# Patient Record
Sex: Female | Born: 1985 | Race: White | Hispanic: No | Marital: Married | Smoking: Never smoker
Health system: Southern US, Community
[De-identification: ages and names within clinical notes are randomized; demographics above are authoritative.]

## PROBLEM LIST (undated history)

## (undated) HISTORY — PX: ARTHROSCOPIC REPAIR ACL: SUR80

---

## 2018-05-15 ENCOUNTER — Other Ambulatory Visit: Payer: Self-pay | Admitting: Otolaryngology

## 2018-05-15 ENCOUNTER — Ambulatory Visit
Admission: RE | Admit: 2018-05-15 | Discharge: 2018-05-15 | Disposition: A | Discharge status: Home / Self Care | Payer: 59 | Source: Ambulatory Visit | Attending: Otolaryngology | Admitting: Otolaryngology

## 2018-05-15 DIAGNOSIS — R059 Cough, unspecified: Secondary | ICD-10-CM

## 2018-05-15 DIAGNOSIS — J40 Bronchitis, not specified as acute or chronic: Secondary | ICD-10-CM

## 2018-05-15 DIAGNOSIS — R05 Cough: Secondary | ICD-10-CM

## 2019-08-29 ENCOUNTER — Other Ambulatory Visit: Payer: 59

## 2020-06-08 ENCOUNTER — Other Ambulatory Visit: Payer: Self-pay | Admitting: Obstetrics & Gynecology

## 2020-06-08 DIAGNOSIS — N979 Female infertility, unspecified: Secondary | ICD-10-CM

## 2020-06-28 ENCOUNTER — Ambulatory Visit
Admission: RE | Admit: 2020-06-28 | Discharge: 2020-06-28 | Disposition: A | Discharge status: Home / Self Care | Payer: 59 | Source: Ambulatory Visit | Attending: Obstetrics & Gynecology | Admitting: Obstetrics & Gynecology

## 2020-06-28 DIAGNOSIS — N979 Female infertility, unspecified: Secondary | ICD-10-CM

## 2020-07-24 NOTE — L&D Delivery Note (Signed)
Delivery Note Labor onset: 04/04/2021  Labor Onset Time: 11:59 PM Complete dilation at 3:59 PM  Onset of pushing at 3:59 PM FHR second stage Cat 2 Analgesia/Anesthesia intrapartum: epidural  Guided pushing with maternal urge. Delivery of a viable female at 4:57 PM. Fetal head delivered in OA with restitution ROA position.  Nuchal cord: N/A.  Infant placed on maternal abd, dried, and tactile stim. Spontaneous cry at delivery. Cord double clamped after 1 minute and cut by father .  2 Rns, Dr. Normand Sloop, Selena Batten Jazalyn Mondor present for birth.  Cord blood sample collected: Yes  Arterial cord blood sample collected: N/A  Placenta delivered Tomasa Blase, intact, with 3 VC via Binnie Kand Maneuver.  Placenta to pathology for possible chorio. Uterine tone firm, bleeding minimal  2nd degree laceration identified.  Anesthesia: epidural Repair: 3-0 vicryl  QBL/EBL (mL): 300 Complications: n/a APGAR: APGAR (1 MIN): 8   APGAR (5 MINS): 9   APGAR (10 MINS):   Mom to postpartum.  Baby to Couplet care / Skin to Skin.  Delivery and repair per Oley Balm, CNM Gerhard Munch Tyresha Fede MSN, CNM 04/05/2021, 5:57 PM

## 2020-09-06 LAB — OB RESULTS CONSOLE ABO/RH: RH Type: POSITIVE

## 2020-09-06 LAB — OB RESULTS CONSOLE RPR: RPR: NONREACTIVE

## 2020-09-06 LAB — OB RESULTS CONSOLE HIV ANTIBODY (ROUTINE TESTING): HIV: NONREACTIVE

## 2020-09-06 LAB — OB RESULTS CONSOLE HEPATITIS B SURFACE ANTIGEN: Hepatitis B Surface Ag: NEGATIVE

## 2020-09-06 LAB — OB RESULTS CONSOLE GC/CHLAMYDIA
Chlamydia: NEGATIVE
Gonorrhea: NEGATIVE

## 2020-09-06 LAB — OB RESULTS CONSOLE ANTIBODY SCREEN: Antibody Screen: NEGATIVE

## 2020-11-12 ENCOUNTER — Ambulatory Visit: Payer: 59

## 2021-03-16 LAB — OB RESULTS CONSOLE GBS: GBS: NEGATIVE

## 2021-03-29 ENCOUNTER — Encounter (HOSPITAL_COMMUNITY): Payer: Self-pay

## 2021-03-29 ENCOUNTER — Other Ambulatory Visit: Payer: Self-pay

## 2021-03-29 ENCOUNTER — Inpatient Hospital Stay (HOSPITAL_COMMUNITY)
Admission: AD | Admit: 2021-03-29 | Discharge: 2021-03-29 | Disposition: A | Discharge status: Home / Self Care | Payer: BC Managed Care – PPO | Attending: Obstetrics & Gynecology | Admitting: Obstetrics & Gynecology

## 2021-03-29 DIAGNOSIS — Z3493 Encounter for supervision of normal pregnancy, unspecified, third trimester: Secondary | ICD-10-CM | POA: Diagnosis not present

## 2021-03-29 DIAGNOSIS — Z3689 Encounter for other specified antenatal screening: Secondary | ICD-10-CM

## 2021-03-29 DIAGNOSIS — Z3A39 39 weeks gestation of pregnancy: Secondary | ICD-10-CM

## 2021-03-29 DIAGNOSIS — Z0371 Encounter for suspected problem with amniotic cavity and membrane ruled out: Secondary | ICD-10-CM | POA: Diagnosis not present

## 2021-03-29 LAB — POCT FERN TEST: POCT Fern Test: NEGATIVE

## 2021-03-29 NOTE — MAU Provider Note (Signed)
Event Date/Time   First Provider Initiated Contact with Patient 03/29/21 1107       S: Ms. Kathy Buchanan is a 35 y.o. G1P0 at [redacted]w[redacted]d  who presents to MAU today complaining of leaking of fluid since Saturday 09/03. She denies vaginal bleeding. She endorses mild contractions. She reports normal fetal movement.  Sexual intercourse last night.  Pain score 1/10.  Patient receives care with [facility].  O: BP 127/81 (BP Location: Right Arm)   Pulse 81   Temp 98.1 F (36.7 C) (Oral)   Resp 17   Ht 5\' 6"  (1.676 m)   Wt 80.6 kg   SpO2 100%   BMI 28.68 kg/m  GENERAL: Well-developed, well-nourished female in no acute distress.  HEAD: Normocephalic, atraumatic.  CHEST: Normal effort of breathing, regular heart rate ABDOMEN: Soft, nontender, gravid PELVIC: Normal external female genitalia. Vagina is pink and rugated. Cervix with normal contour, no lesions. Normal discharge.  Negative pooling.   Cervical exam: FT/60/ballotable per RN exam   Fetal Monitoring: Baseline:135 Variability: Mod Accelerations: 15 x 15 Decelerations: None Contractions: Irregular, occasional  A: SIUP at [redacted]w[redacted]d  Cat I tracing S/p sterile speculum exam performed by CNM Negative pooling Negative gush with Valsalva Negative fern Patient declines one hour of monitoring to assess for cervical change  P: Discharge home in stable condition with labor precautions  [redacted]w[redacted]d, CNM 03/29/2021 12:11 PM

## 2021-03-29 NOTE — MAU Note (Signed)
Presents stating she's had intermittent LOF since Saturday.  States leaked fluid Saturday, Monday and today.  Denies VB.  Endorses +FM.

## 2021-04-01 ENCOUNTER — Other Ambulatory Visit: Payer: Self-pay | Admitting: Obstetrics & Gynecology

## 2021-04-01 ENCOUNTER — Telehealth (HOSPITAL_COMMUNITY): Payer: Self-pay | Admitting: *Deleted

## 2021-04-01 NOTE — Telephone Encounter (Signed)
Preadmission screen  

## 2021-04-04 ENCOUNTER — Encounter (HOSPITAL_COMMUNITY): Payer: Self-pay

## 2021-04-04 ENCOUNTER — Encounter (HOSPITAL_COMMUNITY): Payer: Self-pay | Admitting: *Deleted

## 2021-04-04 ENCOUNTER — Telehealth (HOSPITAL_COMMUNITY): Payer: Self-pay | Admitting: *Deleted

## 2021-04-04 NOTE — Telephone Encounter (Signed)
Preadmission screen  

## 2021-04-05 ENCOUNTER — Inpatient Hospital Stay (HOSPITAL_COMMUNITY): Payer: BC Managed Care – PPO | Admitting: Anesthesiology

## 2021-04-05 ENCOUNTER — Other Ambulatory Visit: Payer: Self-pay

## 2021-04-05 ENCOUNTER — Inpatient Hospital Stay (HOSPITAL_COMMUNITY)
Admission: AD | Admit: 2021-04-05 | Discharge: 2021-04-07 | DRG: 807 | Disposition: A | Discharge status: Home / Self Care | Payer: BC Managed Care – PPO | Attending: Obstetrics & Gynecology | Admitting: Obstetrics & Gynecology

## 2021-04-05 ENCOUNTER — Encounter (HOSPITAL_COMMUNITY): Payer: Self-pay | Admitting: Obstetrics & Gynecology

## 2021-04-05 DIAGNOSIS — Z3A40 40 weeks gestation of pregnancy: Secondary | ICD-10-CM | POA: Diagnosis not present

## 2021-04-05 DIAGNOSIS — O480 Post-term pregnancy: Principal | ICD-10-CM | POA: Diagnosis present

## 2021-04-05 DIAGNOSIS — O26893 Other specified pregnancy related conditions, third trimester: Secondary | ICD-10-CM | POA: Diagnosis present

## 2021-04-05 DIAGNOSIS — Z20822 Contact with and (suspected) exposure to covid-19: Secondary | ICD-10-CM | POA: Diagnosis present

## 2021-04-05 DIAGNOSIS — O321XX0 Maternal care for breech presentation, not applicable or unspecified: Secondary | ICD-10-CM | POA: Diagnosis present

## 2021-04-05 LAB — CBC
HCT: 37.6 % (ref 36.0–46.0)
Hemoglobin: 12.7 g/dL (ref 12.0–15.0)
MCH: 31 pg (ref 26.0–34.0)
MCHC: 33.8 g/dL (ref 30.0–36.0)
MCV: 91.7 fL (ref 80.0–100.0)
Platelets: 258 10*3/uL (ref 150–400)
RBC: 4.1 MIL/uL (ref 3.87–5.11)
RDW: 11.8 % (ref 11.5–15.5)
WBC: 16.8 10*3/uL — ABNORMAL HIGH (ref 4.0–10.5)
nRBC: 0 % (ref 0.0–0.2)

## 2021-04-05 LAB — TYPE AND SCREEN
ABO/RH(D): O POS
Antibody Screen: NEGATIVE

## 2021-04-05 LAB — POCT FERN TEST: POCT Fern Test: POSITIVE

## 2021-04-05 LAB — RESP PANEL BY RT-PCR (FLU A&B, COVID) ARPGX2
Influenza A by PCR: NEGATIVE
Influenza B by PCR: NEGATIVE
SARS Coronavirus 2 by RT PCR: NEGATIVE

## 2021-04-05 LAB — RPR: RPR Ser Ql: NONREACTIVE

## 2021-04-05 MED ORDER — AMPICILLIN-SULBACTAM SODIUM 3 (2-1) G IJ SOLR
3.0000 g | Freq: Four times a day (QID) | INTRAMUSCULAR | Status: AC
  Administered 2021-04-05 – 2021-04-06 (×4): 3 g via INTRAVENOUS
  Filled 2021-04-05 (×4): qty 8

## 2021-04-05 MED ORDER — OXYTOCIN-SODIUM CHLORIDE 30-0.9 UT/500ML-% IV SOLN: 1.0000 m[IU]/min | INTRAVENOUS | Status: DC

## 2021-04-05 MED ORDER — ONDANSETRON HCL 4 MG/2ML IJ SOLN: 4.0000 mg | INTRAMUSCULAR | Status: DC | PRN

## 2021-04-05 MED ORDER — OXYTOCIN-SODIUM CHLORIDE 30-0.9 UT/500ML-% IV SOLN: 2.5000 [IU]/h | INTRAVENOUS | Status: DC

## 2021-04-05 MED ORDER — SIMETHICONE 80 MG PO CHEW: 80.0000 mg | CHEWABLE_TABLET | ORAL | Status: DC | PRN

## 2021-04-05 MED ORDER — LIDOCAINE-EPINEPHRINE (PF) 2 %-1:200000 IJ SOLN
INTRAMUSCULAR | Status: DC | PRN
  Administered 2021-04-05: 5 mL via EPIDURAL

## 2021-04-05 MED ORDER — DIPHENHYDRAMINE HCL 50 MG/ML IJ SOLN
12.5000 mg | INTRAMUSCULAR | Status: DC | PRN
Start: 2021-04-05 — End: 2021-04-05

## 2021-04-05 MED ORDER — DIBUCAINE (PERIANAL) 1 % EX OINT: 1.0000 "application " | TOPICAL_OINTMENT | CUTANEOUS | Status: DC | PRN

## 2021-04-05 MED ORDER — TETANUS-DIPHTH-ACELL PERTUSSIS 5-2.5-18.5 LF-MCG/0.5 IM SUSY: 0.5000 mL | PREFILLED_SYRINGE | Freq: Once | INTRAMUSCULAR | Status: DC

## 2021-04-05 MED ORDER — IBUPROFEN 600 MG PO TABS
600.0000 mg | ORAL_TABLET | Freq: Four times a day (QID) | ORAL | Status: DC
  Administered 2021-04-05 – 2021-04-07 (×6): 600 mg via ORAL
  Filled 2021-04-05 (×6): qty 1

## 2021-04-05 MED ORDER — TERBUTALINE SULFATE 1 MG/ML IJ SOLN: 0.2500 mg | Freq: Once | INTRAMUSCULAR | Status: DC | PRN

## 2021-04-05 MED ORDER — BENZOCAINE-MENTHOL 20-0.5 % EX AERO
1.0000 "application " | INHALATION_SPRAY | CUTANEOUS | Status: DC | PRN
  Administered 2021-04-05: 1 via TOPICAL
  Filled 2021-04-05: qty 56

## 2021-04-05 MED ORDER — ZOLPIDEM TARTRATE 5 MG PO TABS: 5.0000 mg | ORAL_TABLET | Freq: Every evening | ORAL | Status: DC | PRN

## 2021-04-05 MED ORDER — LIDOCAINE HCL (PF) 1 % IJ SOLN: 30.0000 mL | INTRAMUSCULAR | Status: DC | PRN

## 2021-04-05 MED ORDER — PHENYLEPHRINE 40 MCG/ML (10ML) SYRINGE FOR IV PUSH (FOR BLOOD PRESSURE SUPPORT)
80.0000 ug | PREFILLED_SYRINGE | INTRAVENOUS | Status: DC | PRN
  Administered 2021-04-05: 80 ug via INTRAVENOUS
  Filled 2021-04-05: qty 10

## 2021-04-05 MED ORDER — PHENYLEPHRINE 40 MCG/ML (10ML) SYRINGE FOR IV PUSH (FOR BLOOD PRESSURE SUPPORT)
80.0000 ug | PREFILLED_SYRINGE | INTRAVENOUS | Status: AC | PRN
  Administered 2021-04-05 (×3): 80 ug via INTRAVENOUS

## 2021-04-05 MED ORDER — SODIUM CHLORIDE 0.9 % IV SOLN
3.0000 g | Freq: Four times a day (QID) | INTRAVENOUS | Status: DC
  Administered 2021-04-05: 3 g via INTRAVENOUS
  Filled 2021-04-05 (×3): qty 8

## 2021-04-05 MED ORDER — LACTATED RINGERS IV SOLN
500.0000 mL | Freq: Once | INTRAVENOUS | Status: AC
  Administered 2021-04-05: 500 mL via INTRAVENOUS

## 2021-04-05 MED ORDER — ONDANSETRON HCL 4 MG/2ML IJ SOLN: 4.0000 mg | Freq: Four times a day (QID) | INTRAMUSCULAR | Status: DC | PRN

## 2021-04-05 MED ORDER — ONDANSETRON HCL 4 MG PO TABS: 4.0000 mg | ORAL_TABLET | ORAL | Status: DC | PRN

## 2021-04-05 MED ORDER — SODIUM CHLORIDE 0.9 % IV SOLN: INTRAVENOUS | Status: DC

## 2021-04-05 MED ORDER — LACTATED RINGERS IV SOLN: INTRAVENOUS | Status: DC

## 2021-04-05 MED ORDER — ACETAMINOPHEN 500 MG PO TABS
1000.0000 mg | ORAL_TABLET | Freq: Four times a day (QID) | ORAL | Status: DC | PRN
  Administered 2021-04-05: 1000 mg via ORAL
  Filled 2021-04-05: qty 2

## 2021-04-05 MED ORDER — ACETAMINOPHEN 325 MG PO TABS
650.0000 mg | ORAL_TABLET | ORAL | Status: DC | PRN
  Administered 2021-04-06 – 2021-04-07 (×5): 650 mg via ORAL
  Filled 2021-04-05 (×5): qty 2

## 2021-04-05 MED ORDER — LACTATED RINGERS IV SOLN
500.0000 mL | INTRAVENOUS | Status: DC | PRN
  Administered 2021-04-05 (×2): 500 mL via INTRAVENOUS

## 2021-04-05 MED ORDER — OXYTOCIN-SODIUM CHLORIDE 30-0.9 UT/500ML-% IV SOLN
1.0000 m[IU]/min | INTRAVENOUS | Status: DC
  Administered 2021-04-05: 2 m[IU]/min via INTRAVENOUS
  Filled 2021-04-05: qty 500

## 2021-04-05 MED ORDER — OXYCODONE HCL 5 MG PO TABS: 5.0000 mg | ORAL_TABLET | ORAL | Status: DC | PRN

## 2021-04-05 MED ORDER — SENNOSIDES-DOCUSATE SODIUM 8.6-50 MG PO TABS
2.0000 | ORAL_TABLET | Freq: Every day | ORAL | Status: DC
  Administered 2021-04-06: 2 via ORAL
  Filled 2021-04-05: qty 2

## 2021-04-05 MED ORDER — OXYTOCIN BOLUS FROM INFUSION
333.0000 mL | Freq: Once | INTRAVENOUS | Status: AC
  Administered 2021-04-05: 333 mL via INTRAVENOUS

## 2021-04-05 MED ORDER — OXYCODONE HCL 5 MG PO TABS
10.0000 mg | ORAL_TABLET | ORAL | Status: DC | PRN
Start: 2021-04-05 — End: 2021-04-07

## 2021-04-05 MED ORDER — ACETAMINOPHEN 325 MG PO TABS: 650.0000 mg | ORAL_TABLET | ORAL | Status: DC | PRN

## 2021-04-05 MED ORDER — SOD CITRATE-CITRIC ACID 500-334 MG/5ML PO SOLN: 30.0000 mL | ORAL | Status: DC | PRN

## 2021-04-05 MED ORDER — EPHEDRINE 5 MG/ML INJ: 10.0000 mg | INTRAVENOUS | Status: DC | PRN

## 2021-04-05 MED ORDER — DIPHENHYDRAMINE HCL 25 MG PO CAPS: 25.0000 mg | ORAL_CAPSULE | Freq: Four times a day (QID) | ORAL | Status: DC | PRN

## 2021-04-05 MED ORDER — FENTANYL-BUPIVACAINE-NACL 0.5-0.125-0.9 MG/250ML-% EP SOLN
12.0000 mL/h | EPIDURAL | Status: DC | PRN
  Administered 2021-04-05: 12 mL/h via EPIDURAL
  Filled 2021-04-05: qty 250

## 2021-04-05 MED ORDER — WITCH HAZEL-GLYCERIN EX PADS: 1.0000 "application " | MEDICATED_PAD | CUTANEOUS | Status: DC | PRN

## 2021-04-05 MED ORDER — PRENATAL MULTIVITAMIN CH
1.0000 | ORAL_TABLET | Freq: Every day | ORAL | Status: DC
  Administered 2021-04-06: 1 via ORAL
  Filled 2021-04-05: qty 1

## 2021-04-05 MED ORDER — COCONUT OIL OIL: 1.0000 "application " | TOPICAL_OIL | Status: DC | PRN

## 2021-04-05 NOTE — Plan of Care (Signed)

## 2021-04-05 NOTE — H&P (Signed)
OB ADMISSION/ HISTORY & PHYSICAL:  Admission Date: 04/05/2021  3:55 AM  Admit Diagnosis: Normal labor  Kathy Buchanan is a 35 y.o. female G1P0 [redacted]w[redacted]d presenting for labor eval and LOF. Endorses active FM, denies vaginal bleeding. ROM @ 2359 and ctx began @ 0030  History of current pregnancy: G1P0   Patient entered care with [facility] at 11+2 wks.   EDC 9/12 by [place] @ 8+3 wks Anatomy scan:  20+2 wks, complete w/ posterior placenta Antenatal testing: N/A Last evaluation: 22+2  wks breech presentation w/ posterior placenta  Significant prenatal events:  Patient Active Problem List   Diagnosis Date Noted   Normal labor 04/05/2021    Prenatal Labs: ABO, Rh: O/Positive/-- (02/14 0000) Antibody: Negative (02/14 0000) Rubella:   Immune RPR: Nonreactive (02/14 0000)  HBsAg: Negative (02/14 0000)  HIV: Non-reactive (02/14 0000)  GTT: abnormal 1 hr, normal 3 hr GBS: Negative/-- (08/24 0000)  GC/CHL: neg/neg Genetics: low-risk Tdap/influenza vaccines: declined both   OB History  Gravida Para Term Preterm AB Living  1            SAB IAB Ectopic Multiple Live Births               # Outcome Date GA Lbr Len/2nd Weight Sex Delivery Anes PTL Lv  1 Current             Medical / Surgical History: Past medical history: History reviewed. No pertinent past medical history.  Past surgical history:  Past Surgical History:  Procedure Laterality Date   ARTHROSCOPIC REPAIR ACL     Family History: History reviewed. No pertinent family history.  Social History:  reports that she has never smoked. She has never used smokeless tobacco. She reports that she does not use drugs. No history on file for alcohol use.  Allergies: Patient has no known allergies.   Current Medications at time of admission:  Prior to Admission medications   Medication Sig Start Date End Date Taking? Authorizing Provider  cetirizine (ZYRTEC) 10 MG tablet Take 10 mg by mouth daily.   Yes [provider]  Prenatal  Vit-Fe Fumarate-FA (MULTIVITAMIN-PRENATAL) 27-0.8 MG TABS tablet Take 1 tablet by mouth daily at 12 noon.   Yes [provider]    Review of Systems: Constitutional: Negative   HENT: Negative   Eyes: Negative   Respiratory: Negative   Cardiovascular: Negative   Gastrointestinal: Negative  Genitourinary: neg for bloody show, pos for LOF   Musculoskeletal: Negative   Skin: Negative   Neurological: Negative   Endo/Heme/Allergies: Negative   Psychiatric/Behavioral: Negative    Physical Exam: VS: Blood pressure 120/78, pulse 99, temperature 97.8 F (36.6 C), temperature source Oral, resp. rate 17, height 5\' 6"  (1.676 m), weight 80.7 kg, SpO2 100 %. AAO x3, no signs of distress Cardiovascular: RRR Respiratory: Lung fields clear to ausculation GU/GI: Abdomen gravid, non-tender, non-distended, active FM, vertex Extremities: no edema, negative for pain, tenderness, and cords  Cervical exam:Dilation: 4 Effacement (%): 100 Station: -1 Exam by:: [email or web address] RN FHR: baseline rate 135 / variability moderate / accelerations present / absent decelerations TOCO: 2-5 min   Prenatal Transfer Tool  Maternal Diabetes: No Genetic Screening: Normal Maternal Ultrasounds/Referrals: Normal Fetal Ultrasounds or other Referrals:  None Maternal Substance Abuse:  No Significant Maternal Medications:  None Significant Maternal Lab Results: Group B Strep negative    Assessment: 35 y.o. G1P0 [redacted]w[redacted]d SROM    -clear fluid  Latent stage of labor FHR category 1 GBS  neg Pain management plan: Epidural   Plan:  Admit to L&D Routine admission orders Epidural PRN Expectant management Dr Sallye Ober notified of admission and plan of care  Roma Schanz MSN, CNM 04/05/2021 4:57 AM

## 2021-04-05 NOTE — Progress Notes (Signed)
Kathy Buchanan is a 35 y.o. G1P0 at [redacted]w[redacted]d admitted for active labor  Subjective:  Resting comfortably with epidural. C/O rectal pressure with ctx. Ctxs have spaced. R/B/A of pitocin discussed. Pt consented to proceed with pitocin.   Objective: BP 104/63   Pulse 93   Temp (!) 97.5 F (36.4 C) (Oral)   Resp 18   Ht 5\' 6"  (1.676 m)   Wt 80.7 kg   SpO2 99%   BMI 28.73 kg/m  No intake/output data recorded. No intake/output data recorded.  FHT:  FHR: 130 bpm, variability: moderate,  accelerations:  Present,  decelerations:  Absent UC:   regular, every 4 minutes SVE:   Dilation: 7 Effacement (%): 90 Station: 0 Exam by:: A Showfety RN  Labs: Lab Results  Component Value Date   WBC 16.8 (H) 04/05/2021   HGB 12.7 04/05/2021   HCT 37.6 04/05/2021   MCV 91.7 04/05/2021   PLT 258 04/05/2021    Assessment / Plan: Spontaneous labor, progressing normally. Ctx spaced after epidural. Will start pitocin. Titrate 2x2.  Labor: Progressing normally Fetal Wellbeing:  Category I Pain Control:  Epidural I/D:   GBS negative Anticipated MOD:  NSVD Dr 04/07/2021 updated on pt status and POC  Normand Sloop MSN, CNM 04/05/2021, 12:30 PM

## 2021-04-05 NOTE — Anesthesia Procedure Notes (Signed)
Epidural Patient location during procedure: OB Start time: 04/05/2021 5:40 AM End time: 04/05/2021 6:15 AM  Staffing Anesthesiologist: Elmer Picker, MD Performed: anesthesiologist   Preanesthetic Checklist Completed: patient identified, IV checked, risks and benefits discussed, monitors and equipment checked, pre-op evaluation and timeout performed  Epidural Patient position: sitting Prep: DuraPrep and site prepped and draped Patient monitoring: continuous pulse ox, blood pressure, heart rate and cardiac monitor Approach: midline Location: L3-L4 Injection technique: LOR air  Needle:  Needle type: Tuohy  Needle gauge: 17 G Needle length: 9 cm Needle insertion depth: 4 cm Catheter type: closed end flexible Catheter size: 19 Gauge Catheter at skin depth: 10 cm Test dose: negative  Assessment Sensory level: T8 Events: blood not aspirated, injection not painful, no injection resistance, no paresthesia and negative IV test  Additional Notes Patient identified. Risks/Benefits/Options discussed with patient including but not limited to bleeding, infection, nerve damage, paralysis, failed block, incomplete pain control, headache, blood pressure changes, nausea, vomiting, reactions to medication both or allergic, itching and postpartum back pain. Confirmed with bedside nurse the patient's most recent platelet count. Confirmed with patient that they are not currently taking any anticoagulation, have any bleeding history or any family history of bleeding disorders. Patient expressed understanding and wished to proceed. All questions were answered. Sterile technique was used throughout the entire procedure. Please see nursing notes for vital signs. Test dose was given through epidural catheter and negative prior to continuing to dose epidural or start infusion. Warning signs of high block given to the patient including shortness of breath, tingling/numbness in hands, complete motor block,  or any concerning symptoms with instructions to call for help. Patient was given instructions on fall risk and not to get out of bed. All questions and concerns addressed with instructions to call with any issues or inadequate analgesia.    First attempt at L3/4 with LOR at 5cm. Left sided paresthesia present upon catheter insertion. Catheter and Tuohy removed. 2nd attempt at L2/3 with LOR at 4cm. Catheter inserted without difficulty. No paresthesia present. Catheter left at 10cm at skin. Pt tolerated well. VSS. Reason for block:procedure for pain

## 2021-04-05 NOTE — Anesthesia Preprocedure Evaluation (Signed)
Anesthesia Evaluation  Patient identified by MRN, date of birth, ID band Patient awake    Reviewed: Allergy & Precautions, NPO status , Patient's Chart, lab work & pertinent test results  Airway Mallampati: II  TM Distance: >3 FB Neck ROM: Full    Dental no notable dental hx.    Pulmonary neg pulmonary ROS,    Pulmonary exam normal breath sounds clear to auscultation       Cardiovascular negative cardio ROS Normal cardiovascular exam Rhythm:Regular Rate:Normal     Neuro/Psych negative neurological ROS  negative psych ROS   GI/Hepatic negative GI ROS, Neg liver ROS,   Endo/Other  negative endocrine ROS  Renal/GU negative Renal ROS  negative genitourinary   Musculoskeletal negative musculoskeletal ROS (+)   Abdominal   Peds  Hematology negative hematology ROS (+)   Anesthesia Other Findings   Reproductive/Obstetrics (+) Pregnancy                             Anesthesia Physical Anesthesia Plan  ASA: 2  Anesthesia Plan: Epidural   Post-op Pain Management:    Induction:   PONV Risk Score and Plan: Treatment may vary due to age or medical condition  Airway Management Planned: Natural Airway  Additional Equipment:   Intra-op Plan:   Post-operative Plan:   Informed Consent: I have reviewed the patients History and Physical, chart, labs and discussed the procedure including the risks, benefits and alternatives for the proposed anesthesia with the patient or authorized representative who has indicated his/her understanding and acceptance.       Plan Discussed with: Anesthesiologist  Anesthesia Plan Comments: (Patient identified. Risks, benefits, options discussed with patient including but not limited to bleeding, infection, nerve damage, paralysis, failed block, incomplete pain control, headache, blood pressure changes, nausea, vomiting, reactions to medication, itching, and  post partum back pain. Confirmed with bedside nurse the patient's most recent platelet count. Confirmed with the patient that they are not taking any anticoagulation, have any bleeding history or any family history of bleeding disorders. Patient expressed understanding and wishes to proceed. All questions were answered. )        Anesthesia Quick Evaluation  

## 2021-04-05 NOTE — MAU Note (Signed)
Kathy Buchanan is a 35 y.o. at [redacted]w[redacted]d here in MAU reporting: SROM at 2359 tonight with clear fluid. States contractions began at [employer]. Pt denies vaginal bleeding or bloody show. Endorses + fetal movement.  Pain score: 8.5   FHT: 135 Lab orders placed from triage:  MAU labor  Pt states she had her membranes stripped yesterday and has had small amount of red "spotting"

## 2021-04-06 LAB — CBC
HCT: 27.9 % — ABNORMAL LOW (ref 36.0–46.0)
Hemoglobin: 9.8 g/dL — ABNORMAL LOW (ref 12.0–15.0)
MCH: 31.8 pg (ref 26.0–34.0)
MCHC: 35.1 g/dL (ref 30.0–36.0)
MCV: 90.6 fL (ref 80.0–100.0)
Platelets: 192 10*3/uL (ref 150–400)
RBC: 3.08 MIL/uL — ABNORMAL LOW (ref 3.87–5.11)
RDW: 11.8 % (ref 11.5–15.5)
WBC: 16.3 10*3/uL — ABNORMAL HIGH (ref 4.0–10.5)
nRBC: 0 % (ref 0.0–0.2)

## 2021-04-06 NOTE — Progress Notes (Signed)
PPD# 1 SVD w/ 2nd degree repair Information for the patient's newborn:  Sukari, Grist [[id number]]  female    Baby Name Remi   S:   Reports feeling good Tolerating PO fluid and solids No nausea or vomiting Bleeding is light Pain controlled with  PO meds.  Up ad lib / ambulatory / voiding w/o difficulty Feeding: Bottle    O:   VS: BP 120/85 (BP Location: Left Arm)   Pulse 68   Temp (!) 97.5 F (36.4 C) (Oral)   Resp 17   Ht 5\' 6"  (1.676 m)   Wt 80.7 kg   SpO2 100%   Breastfeeding Unknown   BMI 28.73 kg/m   LABS:  Recent Labs    04/05/21 0501 04/06/21 0439  WBC 16.8* 16.3*  HGB 12.7 9.8*  PLT 258 192   Blood type: --/--/O POS (09/13 0500) Rubella:                        I&O: Intake/Output      09/13 0701 09/14 0700 09/14 0701 09/15 0700   I.V. (mL/kg) 0 (0)    Other 0    IV Piggyback 0    Total Intake(mL/kg) 0 (0)    Urine (mL/kg/hr) 700 (0.4)    Blood 300    Total Output 1000    Net -1000           Physical Exam: Alert and oriented X3 Lungs: unlabored Heart: regular rate  Abdomen: soft, non-tender, non-distended  Fundus: firm, non-tender, U-2 Perineum: well approximated, no hemorrhoid Lochia: appropriate  Extremities: no edema, no calf pain, tenderness, or cords    A:  PPD # 1  Normal exam  P:  Routine post partum orders  Anticipate D/C on tomorrow    Plan reviewed w/ Dr. 10/15, MSN, CNM 04/06/2021, 8:48 AM

## 2021-04-06 NOTE — Anesthesia Postprocedure Evaluation (Signed)
Anesthesia Post Note  Patient: Kathy Buchanan  Procedure(s) Performed: AN AD HOC LABOR EPIDURAL     Patient location during evaluation: Mother Baby Anesthesia Type: Epidural Level of consciousness: awake, oriented and awake and alert Pain management: pain level controlled Vital Signs Assessment: post-procedure vital signs reviewed and stable Respiratory status: spontaneous breathing, respiratory function stable and nonlabored ventilation Cardiovascular status: stable Postop Assessment: no headache, adequate PO intake, able to ambulate, patient able to bend at knees and no apparent nausea or vomiting Anesthetic complications: no   No notable events documented.  Last Vitals:  Vitals:   04/06/21 0744 04/06/21 1030  BP: 120/85 120/79  Pulse: 68   Resp: 17   Temp: (!) 36.4 C   SpO2: 100%     Last Pain:  Vitals:   04/06/21 0900  TempSrc:   PainSc: 0-No pain   Pain Goal:                   [name]

## 2021-04-07 LAB — URINE CULTURE
Culture: NO GROWTH
Special Requests: NORMAL

## 2021-04-07 LAB — SURGICAL PATHOLOGY

## 2021-04-07 MED ORDER — SENNOSIDES-DOCUSATE SODIUM 8.6-50 MG PO TABS: 2.0000 | ORAL_TABLET | ORAL | 3 refills | Status: AC | PRN

## 2021-04-07 MED ORDER — ACETAMINOPHEN 325 MG PO TABS: 650.0000 mg | ORAL_TABLET | Freq: Four times a day (QID) | ORAL | 3 refills | Status: AC | PRN

## 2021-04-07 MED ORDER — IBUPROFEN 600 MG PO TABS: 600.0000 mg | ORAL_TABLET | Freq: Four times a day (QID) | ORAL | 3 refills | Status: AC | PRN

## 2021-04-07 NOTE — Discharge Summary (Signed)
   Postpartum Discharge Summary  Date of Service updated 04/07/21     Patient Name: Kathy Buchanan DOB: 04/17/1986 MRN: [id number]  Date of admission: 04/05/2021 Delivery date:04/05/2021  Delivering provider: SLAUGHTERBECK, EMILY  Date of discharge: 04/07/2021  Admitting diagnosis: Post-dates pregnancy [O48.0] Intrauterine pregnancy: [redacted]w[redacted]d     Secondary diagnosis:  Active Problems:   Normal labor   SVD (9/13)  Additional problems: None    Discharge diagnosis: Term Pregnancy Delivered                                              Post partum procedures: none Augmentation: AROM and Pitocin Complications: None  Hospital course: Onset of Labor With Vaginal Delivery      34 y.o. yo G1P1001 at [redacted]w[redacted]d was admitted in Active Labor on 04/05/2021. Patient had an uncomplicated labor course as follows:  Membrane Rupture Time/Date: 11:59 PM ,04/04/2021   Delivery Method:Vaginal, Spontaneous  Episiotomy: None  Lacerations:  2nd degree  Patient had an uncomplicated postpartum course.  She is ambulating, tolerating a regular diet, passing flatus, and urinating well. Patient is discharged home in stable condition on 04/07/21.  Newborn Data: Birth date:04/05/2021  Birth time:4:57 PM  Gender:Female  Living status:Living  Apgars:8 ,9  Weight:3610 g   Magnesium Sulfate received: No BMZ received: No Rhophylac:No MMR:No T-DaP:Given prenatally Flu: No Transfusion:No  Physical exam  Vitals:   04/06/21 1030 04/06/21 1500 04/06/21 1946 04/07/21 0514  BP: 120/79 109/74 110/78 119/81  Pulse:  61 (!) 53 (!) 55  Resp:  18 16 18  Temp:  (!) 97.5 F (36.4 C) 97.9 F (36.6 C) 98 F (36.7 C)  TempSrc:  Oral Oral Oral  SpO2:   98% 100%  Weight:      Height:       General: alert, cooperative, and no distress Lochia: appropriate Uterine Fundus: firm Perineum: Healing well with no significant drainage DVT Evaluation: No evidence of DVT seen on physical exam. Negative Homan's sign. No cords or  calf tenderness. Labs: Lab Results  Component Value Date   WBC 16.3 (H) 04/06/2021   HGB 9.8 (L) 04/06/2021   HCT 27.9 (L) 04/06/2021   MCV 90.6 04/06/2021   PLT 192 04/06/2021   No flowsheet data found. Edinburgh Score: Edinburgh Postnatal Depression Scale Screening Tool 04/06/2021  I have been able to laugh and see the funny side of things. 1  I have looked forward with enjoyment to things. 1  I have blamed myself unnecessarily when things went wrong. 0  I have been anxious or worried for no good reason. 2  I have felt scared or panicky for no good reason. 1  Things have been getting on top of me. 2  I have been so unhappy that I have had difficulty sleeping. 0  I have felt sad or miserable. 1  I have been so unhappy that I have been crying. 1  The thought of harming myself has occurred to me. 0  Edinburgh Postnatal Depression Scale Total 9      After visit meds:  Allergies as of 04/07/2021   No Known Allergies      Medication List     TAKE these medications    acetaminophen 325 MG tablet Commonly known as: Tylenol Take 2 tablets (650 mg total) by mouth every 6 (six) hours as needed for mild pain (  for pain scale < 4 Do not exceed 3000mg in 24 hours).   cetirizine 10 MG tablet Commonly known as: ZYRTEC Take 10 mg by mouth daily.   ibuprofen 600 MG tablet Commonly known as: ADVIL Take 1 tablet (600 mg total) by mouth every 6 (six) hours as needed for moderate pain or cramping.   multivitamin-prenatal 27-0.8 MG Tabs tablet Take 1 tablet by mouth daily at 12 noon.   senna-docusate 8.6-50 MG tablet Commonly known as: Senokot-S Take 2 tablets by mouth as needed for mild constipation.         Discharge home in stable condition Infant Feeding: Bottle Infant Disposition:home with mother Discharge instruction: per After Visit Summary and Postpartum booklet. Activity: Advance as tolerated. Pelvic rest for 6 weeks.  Diet: routine diet Anticipated Birth  Control: Unsure Postpartum Appointment:4 -6weeks  Future Appointments:No future appointments. Follow up Visit:      04/07/2021 Walda Pinn, MD   

## 2021-04-11 ENCOUNTER — Inpatient Hospital Stay (HOSPITAL_COMMUNITY)
Admission: AD | Admit: 2021-04-11 | Payer: BC Managed Care – PPO | Source: Home / Self Care | Admitting: Obstetrics & Gynecology

## 2021-04-11 ENCOUNTER — Inpatient Hospital Stay (HOSPITAL_COMMUNITY): Payer: BC Managed Care – PPO

## 2021-04-19 ENCOUNTER — Telehealth (HOSPITAL_COMMUNITY): Payer: Self-pay | Admitting: *Deleted

## 2021-04-19 NOTE — Telephone Encounter (Signed)
Attempted hospital discharge follow-up call. Left message for patient to return RN call. Deforest Hoyles, RN, 04/19/21, 1710.

## 2022-12-05 IMAGING — RF DG HYSTEROGRAM
1 series · 4 of 4 positions shown · IV contrast (omnipaque)
Comparison: None.

CLINICAL DATA: Infertility

EXAM:
HYSTEROSALPINGOGRAM
TECHNIQUE: Following cleansing of the cervix and vagina with Betadine solution,
a hysterosalpingogram was performed using a 5-French
hysterosalpingogram catheter and Omnipaque 300 contrast. The patient
tolerated the examination without difficulty.

[Series 1: one shot · 0.14mm/px · 4 of 4 slices shown]
[im 1/4]
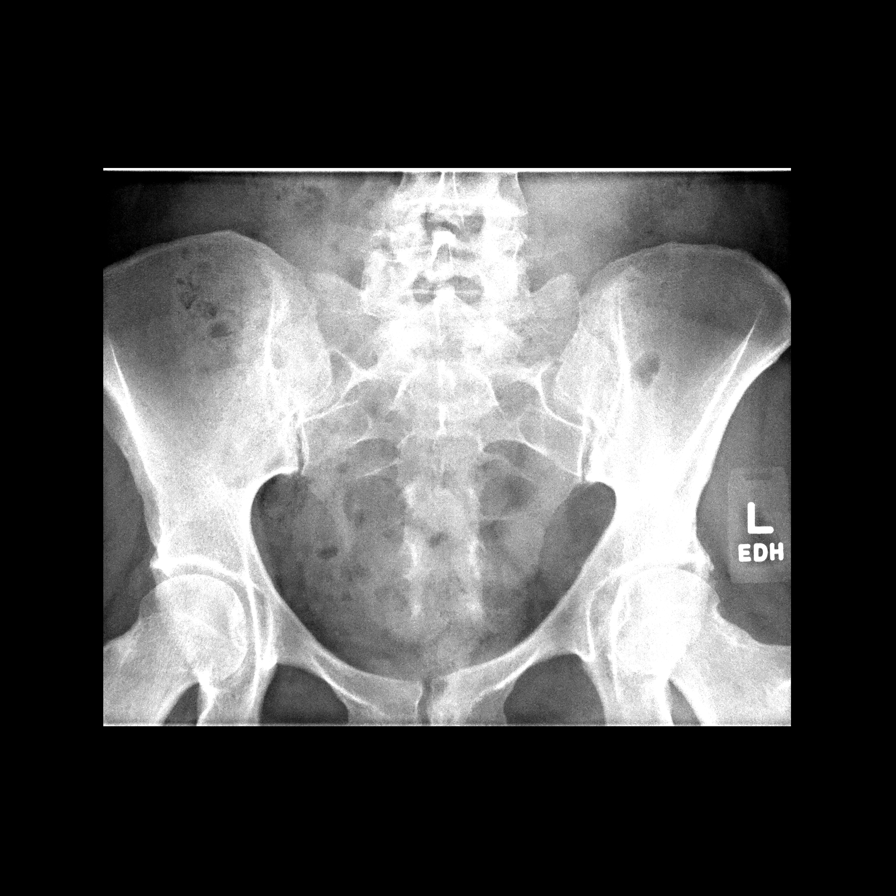
[im 2/4]
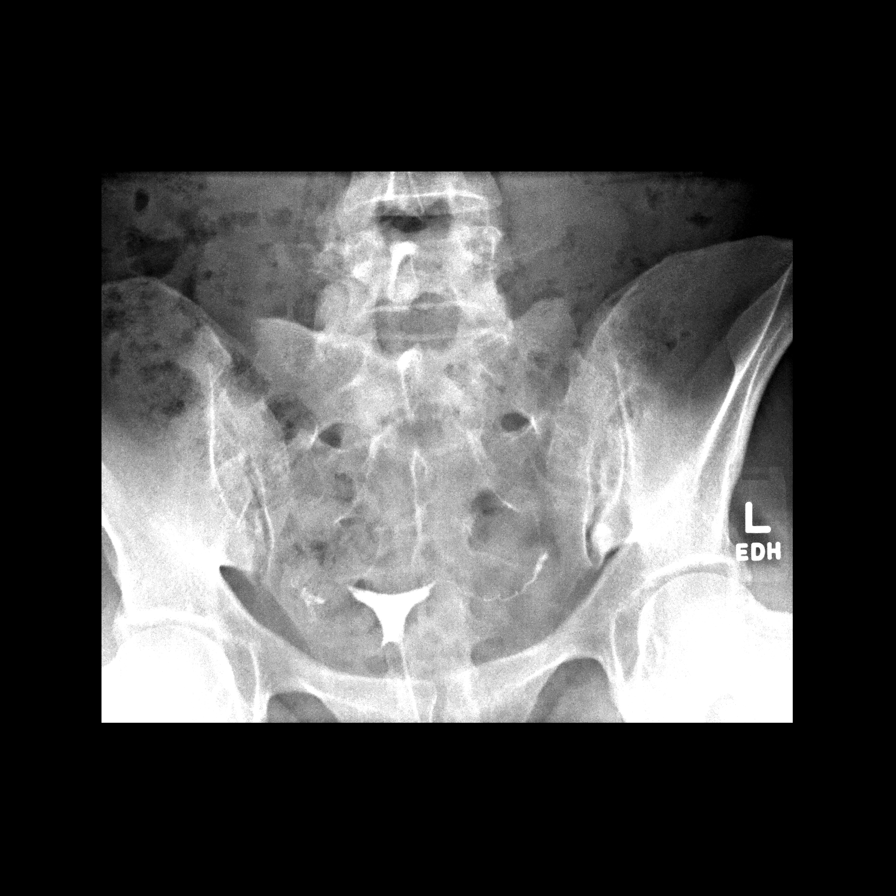
[im 3/4]
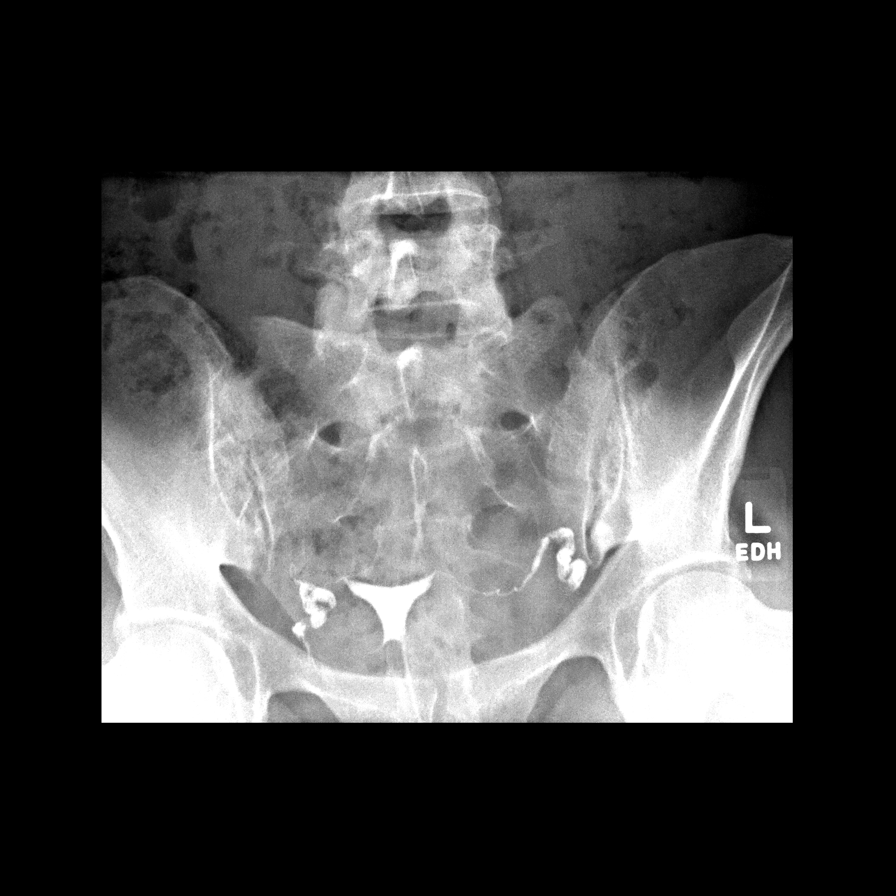
[im 4/4]
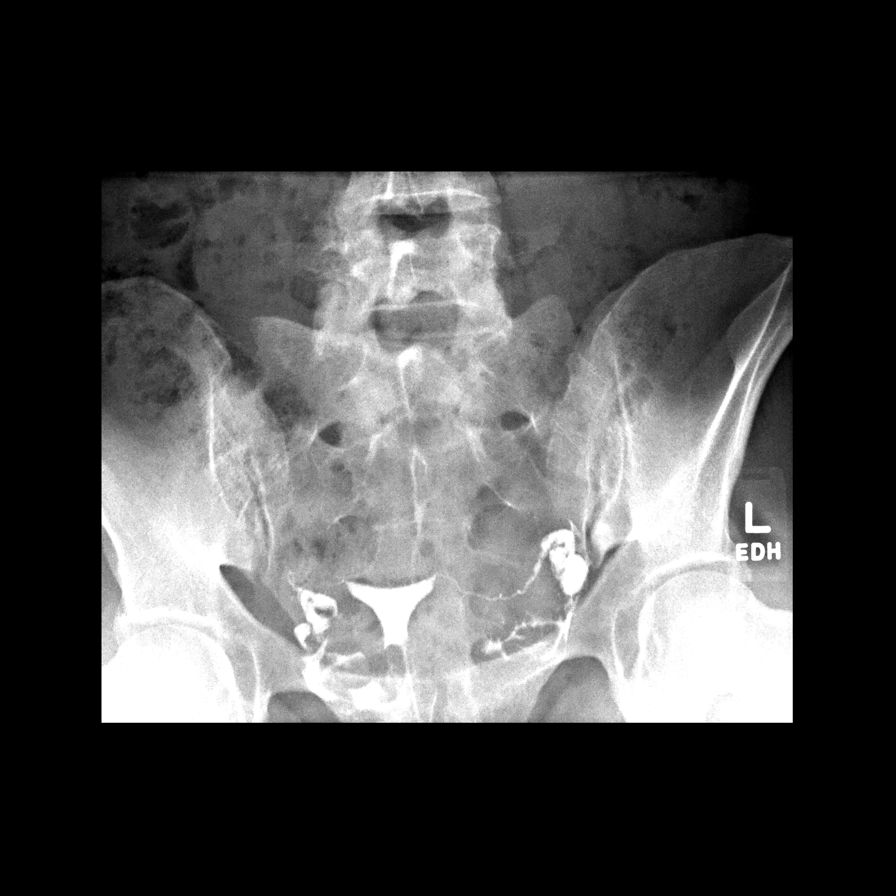

[4 of 4 positions shown; findings below may reference images not displayed]

FLUOROSCOPY TIME:  Radiation Exposure Index (as provided by the
fluoroscopic device): 47 mGy

If the device does not provide the exposure index:

Fluoroscopy Time:  36 seconds

Number of Acquired Images:  3
FINDINGS: Endometrial cavity is normal. Both fallopian tubes fill and have a
normal appearance. Normal spillage bilaterally.
IMPRESSION: Normal study.

## 2024-04-24 ENCOUNTER — Encounter: Payer: Self-pay | Admitting: Obstetrics and Gynecology
